# Patient Record
Sex: Female | Born: 1955 | Race: White | Hispanic: No | Marital: Married
Health system: Midwestern US, Academic
[De-identification: ages and names within clinical notes are randomized; demographics above are authoritative.]

---

## 2019-08-25 ENCOUNTER — Encounter: Admit: 2019-08-25 | Discharge: 2019-08-25 | Payer: BC Managed Care – PPO

## 2019-08-26 ENCOUNTER — Encounter: Admit: 2019-08-26 | Discharge: 2019-08-26 | Payer: BC Managed Care – PPO

## 2019-09-10 ENCOUNTER — Encounter: Admit: 2019-09-10 | Discharge: 2019-09-10 | Payer: BC Managed Care – PPO

## 2019-09-10 ENCOUNTER — Ambulatory Visit: Admit: 2019-09-10 | Discharge: 2019-09-10 | Payer: BC Managed Care – PPO

## 2019-09-10 DIAGNOSIS — J302 Other seasonal allergic rhinitis: Secondary | ICD-10-CM

## 2019-09-10 DIAGNOSIS — I10 Essential (primary) hypertension: Secondary | ICD-10-CM

## 2019-09-10 DIAGNOSIS — Z720 Tobacco use: Secondary | ICD-10-CM

## 2019-09-10 DIAGNOSIS — R519 Generalized headaches: Secondary | ICD-10-CM

## 2019-09-10 DIAGNOSIS — E7800 Pure hypercholesterolemia, unspecified: Secondary | ICD-10-CM

## 2019-09-10 LAB — TSH WITH FREE T4 REFLEX: Lab: 1.2 uU/mL (ref 0.35–5.00)

## 2019-09-10 LAB — PARATHYROID HORMONE: Lab: 437 pg/mL — ABNORMAL HIGH (ref 10–65)

## 2019-09-10 LAB — CALCIUM: Lab: 12 mg/dL — ABNORMAL HIGH (ref 8.5–10.6)

## 2019-09-10 LAB — IONIZED CALCIUM: Lab: 1.7 MMOL/L — ABNORMAL HIGH (ref 1.0–1.3)

## 2019-09-11 ENCOUNTER — Encounter: Admit: 2019-09-11 | Discharge: 2019-09-11 | Payer: BC Managed Care – PPO

## 2019-09-14 NOTE — Telephone Encounter
Pt calling to set up Surgery Schedule

## 2019-09-22 ENCOUNTER — Encounter: Admit: 2019-09-22 | Discharge: 2019-09-22 | Payer: BC Managed Care – PPO

## 2019-09-22 NOTE — Telephone Encounter
Spoke with patient about what preoperative testing she needs before scheduling surgery. Dr. Edilia Bo stated in his previous note that he wanted new labs (which patient obtained), a CT (getting on 10/15), 24hr urine(order faxed to Dr. Dion Body office) , and previous Dexa scan (requested from Dr. Dion Body office). Patient v/u and once testing is completed will contact Dr. Edilia Bo for surgery dates.

## 2019-09-22 NOTE — Telephone Encounter
Pt calling to reach Nurse in regards to Surgery Dates. Had questions to testing needing to be done.

## 2019-09-24 ENCOUNTER — Ambulatory Visit: Admit: 2019-09-24 | Discharge: 2019-09-24 | Payer: BC Managed Care – PPO

## 2019-09-24 ENCOUNTER — Encounter: Admit: 2019-09-24 | Discharge: 2019-09-24 | Payer: BC Managed Care – PPO

## 2019-09-24 LAB — POC CREATININE, RAD: Lab: 1.3 mg/dL — ABNORMAL HIGH (ref 0.4–1.00)

## 2019-09-24 MED ORDER — SODIUM CHLORIDE 0.9 % IJ SOLN
50 mL | Freq: Once | INTRAVENOUS | 0 refills | Status: CP
Start: 2019-09-24 — End: ?
  Administered 2019-09-24: 20:00:00 50 mL via INTRAVENOUS

## 2019-09-24 MED ORDER — IOHEXOL 350 MG IODINE/ML IV SOLN
80 mL | Freq: Once | INTRAVENOUS | 0 refills | Status: CP
Start: 2019-09-24 — End: ?
  Administered 2019-09-24: 20:00:00 80 mL via INTRAVENOUS

## 2019-09-25 ENCOUNTER — Encounter: Admit: 2019-09-25 | Discharge: 2019-09-25 | Payer: BC Managed Care – PPO

## 2019-09-25 DIAGNOSIS — E210 Primary hyperparathyroidism: Secondary | ICD-10-CM

## 2019-09-25 DIAGNOSIS — Z1159 Encounter for screening for other viral diseases: Secondary | ICD-10-CM

## 2019-09-28 ENCOUNTER — Encounter: Admit: 2019-09-28 | Discharge: 2019-09-28 | Payer: BC Managed Care – PPO

## 2019-09-28 DIAGNOSIS — J302 Other seasonal allergic rhinitis: Secondary | ICD-10-CM

## 2019-09-28 DIAGNOSIS — Z720 Tobacco use: Secondary | ICD-10-CM

## 2019-09-28 DIAGNOSIS — E7800 Pure hypercholesterolemia, unspecified: Secondary | ICD-10-CM

## 2019-09-28 DIAGNOSIS — I10 Essential (primary) hypertension: Secondary | ICD-10-CM

## 2019-09-28 DIAGNOSIS — R519 Generalized headaches: Secondary | ICD-10-CM

## 2019-10-04 ENCOUNTER — Encounter: Admit: 2019-10-04 | Discharge: 2019-10-05 | Payer: BC Managed Care – PPO

## 2019-10-04 DIAGNOSIS — Z1159 Encounter for screening for other viral diseases: Secondary | ICD-10-CM

## 2019-10-06 ENCOUNTER — Encounter: Admit: 2019-10-06 | Discharge: 2019-10-06 | Payer: BC Managed Care – PPO

## 2019-10-07 ENCOUNTER — Encounter: Admit: 2019-10-07 | Discharge: 2019-10-07 | Payer: BC Managed Care – PPO

## 2019-10-07 DIAGNOSIS — E7800 Pure hypercholesterolemia, unspecified: Secondary | ICD-10-CM

## 2019-10-07 DIAGNOSIS — R519 Generalized headaches: Secondary | ICD-10-CM

## 2019-10-07 DIAGNOSIS — Z720 Tobacco use: Secondary | ICD-10-CM

## 2019-10-07 DIAGNOSIS — I10 Essential (primary) hypertension: Secondary | ICD-10-CM

## 2019-10-07 DIAGNOSIS — J302 Other seasonal allergic rhinitis: Secondary | ICD-10-CM

## 2019-10-07 MED ORDER — DEXAMETHASONE SODIUM PHOSPHATE 4 MG/ML IJ SOLN
INTRAVENOUS | 0 refills | Status: DC
Start: 2019-10-07 — End: 2019-10-07
  Administered 2019-10-07: 21:00:00 10 mg via INTRAVENOUS

## 2019-10-07 MED ORDER — DEXTRAN 70-HYPROMELLOSE (PF) 0.1-0.3 % OP DPET
0 refills | Status: DC
Start: 2019-10-07 — End: 2019-10-07
  Administered 2019-10-07: 19:00:00 2 [drp] via OPHTHALMIC

## 2019-10-07 MED ORDER — SUCCINYLCHOLINE CHLORIDE 20 MG/ML IJ SOLN
INTRAVENOUS | 0 refills | Status: DC
Start: 2019-10-07 — End: 2019-10-07
  Administered 2019-10-07: 19:00:00 100 mg via INTRAVENOUS

## 2019-10-07 MED ORDER — LISINOPRIL 20 MG PO TAB
20 mg | Freq: Every day | ORAL | 0 refills | Status: DC
Start: 2019-10-07 — End: 2019-10-08

## 2019-10-07 MED ORDER — ONDANSETRON HCL (PF) 4 MG/2 ML IJ SOLN
4 mg | Freq: Once | INTRAVENOUS | 0 refills | Status: DC | PRN
Start: 2019-10-07 — End: 2019-10-07

## 2019-10-07 MED ORDER — DIPHENOXYLATE-ATROPINE 2.5-0.025 MG PO TAB
1 | Freq: Four times a day (QID) | ORAL | 0 refills | Status: DC | PRN
Start: 2019-10-07 — End: 2019-10-08

## 2019-10-07 MED ORDER — ONDANSETRON HCL (PF) 4 MG/2 ML IJ SOLN
INTRAVENOUS | 0 refills | Status: DC
Start: 2019-10-07 — End: 2019-10-07
  Administered 2019-10-07: 21:00:00 4 mg via INTRAVENOUS

## 2019-10-07 MED ORDER — ACETAMINOPHEN 325 MG PO TAB
650 mg | ORAL | 0 refills | Status: DC | PRN
Start: 2019-10-07 — End: 2019-10-08
  Administered 2019-10-07 – 2019-10-08 (×2): 650 mg via ORAL

## 2019-10-07 MED ORDER — OXYCODONE 5 MG PO TAB
5-10 mg | Freq: Once | ORAL | 0 refills | Status: DC | PRN
Start: 2019-10-07 — End: 2019-10-07

## 2019-10-07 MED ORDER — SODIUM CHLORIDE 0.9 % IV SOLP
INTRAVENOUS | 0 refills | Status: DC
Start: 2019-10-07 — End: 2019-10-08
  Administered 2019-10-07: 23:00:00 1000.000 mL via INTRAVENOUS

## 2019-10-07 MED ORDER — HALOPERIDOL LACTATE 5 MG/ML IJ SOLN
1 mg | Freq: Once | INTRAVENOUS | 0 refills | Status: DC | PRN
Start: 2019-10-07 — End: 2019-10-07

## 2019-10-07 MED ORDER — PROPOFOL 10 MG/ML IV EMUL 100 ML (INFUSION)(AM)(OR)
0 refills | Status: DC
Start: 2019-10-07 — End: 2019-10-07
  Administered 2019-10-07: 19:00:00 30 ug/kg/min via INTRAVENOUS

## 2019-10-07 MED ORDER — PATCH DOCUMENTATION - SCOPOLAMINE BASE 1 MG/72HR
1 | Freq: Two times a day (BID) | TRANSDERMAL | 0 refills | Status: DC
Start: 2019-10-07 — End: 2019-10-08

## 2019-10-07 MED ORDER — ONDANSETRON HCL (PF) 4 MG/2 ML IJ SOLN
4 mg | INTRAVENOUS | 0 refills | Status: DC | PRN
Start: 2019-10-07 — End: 2019-10-08

## 2019-10-07 MED ORDER — FENTANYL CITRATE (PF) 50 MCG/ML IJ SOLN
25 ug | INTRAVENOUS | 0 refills | Status: DC | PRN
Start: 2019-10-07 — End: 2019-10-07
  Administered 2019-10-07 (×4): 25 ug via INTRAVENOUS

## 2019-10-07 MED ORDER — AMLODIPINE 5 MG PO TAB
5 mg | Freq: Every day | ORAL | 0 refills | Status: DC
Start: 2019-10-07 — End: 2019-10-08
  Administered 2019-10-08: 14:00:00 5 mg via ORAL

## 2019-10-07 MED ORDER — CEFAZOLIN 1 GRAM IJ SOLR
0 refills | Status: DC
Start: 2019-10-07 — End: 2019-10-07
  Administered 2019-10-07: 19:00:00 2 g via INTRAVENOUS

## 2019-10-07 MED ORDER — LIDOCAINE (PF) 200 MG/10 ML (2 %) IJ SYRG
0 refills | Status: DC
Start: 2019-10-07 — End: 2019-10-07
  Administered 2019-10-07: 19:00:00 100 mg via INTRAVENOUS

## 2019-10-07 MED ORDER — LIDOCAINE-EPINEPHRINE 1 %-1:100,000 IJ SOLN
0 refills | Status: DC
Start: 2019-10-07 — End: 2019-10-07
  Administered 2019-10-07: 19:00:00 5 mL via INTRAMUSCULAR

## 2019-10-07 MED ORDER — SIMVASTATIN 40 MG PO TAB
40 mg | Freq: Every evening | ORAL | 0 refills | Status: DC
Start: 2019-10-07 — End: 2019-10-08
  Administered 2019-10-08: 03:00:00 40 mg via ORAL

## 2019-10-07 MED ORDER — OXYCODONE 5 MG PO TAB
5 mg | ORAL | 0 refills | Status: DC | PRN
Start: 2019-10-07 — End: 2019-10-08
  Administered 2019-10-08 (×2): 5 mg via ORAL

## 2019-10-07 MED ORDER — BISACODYL 10 MG RE SUPP
10 mg | Freq: Every day | RECTAL | 0 refills | Status: DC | PRN
Start: 2019-10-07 — End: 2019-10-08

## 2019-10-07 MED ORDER — HEPARIN, PORCINE (PF) 5,000 UNIT/0.5 ML IJ SYRG
5000 [IU] | SUBCUTANEOUS | 0 refills | Status: DC
Start: 2019-10-07 — End: 2019-10-08
  Administered 2019-10-08: 14:00:00 5000 [IU] via SUBCUTANEOUS

## 2019-10-07 MED ORDER — SCOPOLAMINE BASE 1 MG OVER 3 DAYS TD PT3D
1 | TRANSDERMAL | 0 refills | Status: DC
Start: 2019-10-07 — End: 2019-10-08
  Administered 2019-10-07: 23:00:00 1 via TRANSDERMAL

## 2019-10-07 MED ORDER — SUFENTANIL CITRATE 50 MCG/ML IV SOLN
0 refills | Status: DC
Start: 2019-10-07 — End: 2019-10-07
  Administered 2019-10-07 (×2): 5 ug via INTRAVENOUS
  Administered 2019-10-07: 19:00:00 20 ug via INTRAVENOUS

## 2019-10-07 MED ORDER — SODIUM CHLORIDE 0.9 % IR SOLN
0 refills | Status: DC
Start: 2019-10-07 — End: 2019-10-07
  Administered 2019-10-07: 19:00:00 1000 mL

## 2019-10-07 MED ORDER — MIDAZOLAM 1 MG/ML IJ SOLN
INTRAVENOUS | 0 refills | Status: DC
Start: 2019-10-07 — End: 2019-10-07
  Administered 2019-10-07: 19:00:00 2 mg via INTRAVENOUS

## 2019-10-07 MED ORDER — EPINEPHRINE 0.1 MG/ML IJ SYRG
0 refills | Status: DC
Start: 2019-10-07 — End: 2019-10-07
  Administered 2019-10-07: 21:00:00 10 mL via INTRAMUSCULAR

## 2019-10-07 MED ORDER — INDIGOTINDISULFONATE SODIUM 8 MG/ML (0.8 %) IJ SOLN
0 refills | Status: DC
Start: 2019-10-07 — End: 2019-10-07
  Administered 2019-10-07: 19:00:00 1 mL via TOPICAL

## 2019-10-07 MED ORDER — PROMETHAZINE 25 MG/ML IJ SOLN
6.25 mg | INTRAVENOUS | 0 refills | Status: DC | PRN
Start: 2019-10-07 — End: 2019-10-07

## 2019-10-07 MED ORDER — SODIUM CHLORIDE 0.9 % IV SOLP
1000 mL | INTRAVENOUS | 0 refills | Status: DC
Start: 2019-10-07 — End: 2019-10-07
  Administered 2019-10-07: 16:00:00 1000 mL via INTRAVENOUS

## 2019-10-07 MED ORDER — MELATONIN 5 MG PO TAB
5 mg | Freq: Every evening | ORAL | 0 refills | Status: DC
Start: 2019-10-07 — End: 2019-10-08

## 2019-10-07 MED ORDER — PROPOFOL INJ 10 MG/ML IV VIAL
0 refills | Status: DC
Start: 2019-10-07 — End: 2019-10-07
  Administered 2019-10-07: 19:00:00 30 mg via INTRAVENOUS
  Administered 2019-10-07: 19:00:00 120 mg via INTRAVENOUS

## 2019-10-07 MED ORDER — LISINOPRIL 20 MG PO TAB
20 mg | Freq: Every day | ORAL | 0 refills | Status: DC
Start: 2019-10-07 — End: 2019-10-08
  Administered 2019-10-08: 03:00:00 20 mg via ORAL

## 2019-10-07 MED ORDER — PHENYLEPHRINE IN 0.9% NACL(PF) 1 MG/10 ML (100 MCG/ML) IV SYRG
INTRAVENOUS | 0 refills | Status: DC
Start: 2019-10-07 — End: 2019-10-07
  Administered 2019-10-07: 19:00:00 100 ug via INTRAVENOUS
  Administered 2019-10-07: 19:00:00 150 ug via INTRAVENOUS
  Administered 2019-10-07: 19:00:00 100 ug via INTRAVENOUS
  Administered 2019-10-07: 19:00:00 50 ug via INTRAVENOUS
  Administered 2019-10-07: 19:00:00 200 ug via INTRAVENOUS

## 2019-10-07 MED ORDER — LIDOCAINE (PF) 10 MG/ML (1 %) IJ SOLN
.1-2 mL | INTRAMUSCULAR | 0 refills | Status: DC | PRN
Start: 2019-10-07 — End: 2019-10-07

## 2019-10-07 MED ORDER — PHENYLEPHRINE 40 MCG/ML IN NS IV DRIP (STD CONC)
0 refills | Status: DC
Start: 2019-10-07 — End: 2019-10-07
  Administered 2019-10-07 (×2): 0.5 ug/kg/min via INTRAVENOUS

## 2019-10-07 MED ORDER — ONDANSETRON HCL (PF) 4 MG/2 ML IJ SOLN
4 mg | Freq: Once | INTRAVENOUS | 0 refills | Status: CP
Start: 2019-10-07 — End: ?
  Administered 2019-10-07: 16:00:00 4 mg via INTRAVENOUS

## 2019-10-07 MED ORDER — FENTANYL CITRATE (PF) 50 MCG/ML IJ SOLN
50 ug | INTRAVENOUS | 0 refills | Status: DC | PRN
Start: 2019-10-07 — End: 2019-10-07

## 2019-10-07 MED ORDER — MAGNESIUM HYDROXIDE 2,400 MG/10 ML PO SUSP
10 mL | Freq: Every day | ORAL | 0 refills | Status: DC | PRN
Start: 2019-10-07 — End: 2019-10-08

## 2019-10-08 MED ORDER — MAGNESIUM SULFATE IN D5W 1 GRAM/100 ML IV PGBK
1 g | INTRAVENOUS | 0 refills | Status: CP
Start: 2019-10-08 — End: ?
  Administered 2019-10-08: 14:00:00 1 g via INTRAVENOUS

## 2019-10-08 MED ORDER — LORATADINE 10 MG PO TAB
10 mg | Freq: Every day | ORAL | 0 refills | Status: DC
Start: 2019-10-08 — End: 2019-10-08

## 2019-10-08 MED ORDER — OXYCODONE 5 MG PO TAB
5 mg | ORAL_TABLET | ORAL | 0 refills | 6.00000 days | Status: AC | PRN
Start: 2019-10-08 — End: ?

## 2019-10-08 MED ORDER — FLUTICASONE PROPIONATE 50 MCG/ACTUATION NA SPSN
1 | Freq: Every day | NASAL | 0 refills | Status: DC
Start: 2019-10-08 — End: 2019-10-08
  Administered 2019-10-08: 15:00:00 1 via NASAL

## 2019-10-08 MED ORDER — ACETAMINOPHEN 650 MG PO TBER
650 mg | ORAL_TABLET | ORAL | 0 refills | Status: AC | PRN
Start: 2019-10-08 — End: ?

## 2019-10-08 MED ORDER — MONTELUKAST 10 MG PO TAB
10 mg | Freq: Every day | ORAL | 0 refills | Status: DC
Start: 2019-10-08 — End: 2019-10-08

## 2019-10-09 ENCOUNTER — Encounter: Admit: 2019-10-09 | Discharge: 2019-10-09 | Payer: BC Managed Care – PPO

## 2019-10-09 DIAGNOSIS — E7800 Pure hypercholesterolemia, unspecified: Secondary | ICD-10-CM

## 2019-10-09 DIAGNOSIS — Z720 Tobacco use: Secondary | ICD-10-CM

## 2019-10-09 DIAGNOSIS — J302 Other seasonal allergic rhinitis: Secondary | ICD-10-CM

## 2019-10-09 DIAGNOSIS — I10 Essential (primary) hypertension: Secondary | ICD-10-CM

## 2019-10-09 DIAGNOSIS — R519 Generalized headaches: Secondary | ICD-10-CM

## 2019-10-12 ENCOUNTER — Encounter: Admit: 2019-10-12 | Discharge: 2019-10-12 | Payer: BC Managed Care – PPO

## 2019-10-12 NOTE — Telephone Encounter
Pt calling in regards to scheduling a Calcium Count?  Wanted to speak to nurse

## 2019-10-12 NOTE — Telephone Encounter
Called patient back in regards to calcium labs. She stated that over the weekend she started to not feel right. She felt tingling in arms and all over. She called Dr. Edilia Bo and was told to get levels checked in [place] which she did today. I will call [facility] tomorrow at [phone] to have results faxed over so we can continue plan of care for patient. I will call patient after results are obtained.

## 2019-10-13 ENCOUNTER — Observation Stay: Admit: 2019-10-13 | Discharge: 2019-10-13 | Payer: BC Managed Care – PPO

## 2019-10-13 ENCOUNTER — Encounter: Admit: 2019-10-13 | Discharge: 2019-10-13 | Payer: BC Managed Care – PPO

## 2019-10-13 DIAGNOSIS — J302 Other seasonal allergic rhinitis: Secondary | ICD-10-CM

## 2019-10-13 DIAGNOSIS — I10 Essential (primary) hypertension: Secondary | ICD-10-CM

## 2019-10-13 DIAGNOSIS — Z720 Tobacco use: Secondary | ICD-10-CM

## 2019-10-13 DIAGNOSIS — E7800 Pure hypercholesterolemia, unspecified: Secondary | ICD-10-CM

## 2019-10-13 DIAGNOSIS — R519 Generalized headaches: Secondary | ICD-10-CM

## 2019-10-13 MED ORDER — CALCIUM GLUCONATE 100 MG/ML (10%) IV SOLN
1 g | Freq: Once | INTRAVENOUS | 0 refills | Status: DC
Start: 2019-10-13 — End: 2019-10-14

## 2019-10-13 MED ORDER — CALCIUM GLUCONATE 1G/100ML IVPB (MB+)
1 g | Freq: Once | INTRAVENOUS | 0 refills | Status: CP
Start: 2019-10-13 — End: ?

## 2019-10-13 NOTE — Telephone Encounter
Called patient to let her know that her Ca+ is low at 6.9, which was drawn at [facility] yesterday (phone number [phone]). Dr. Edilia Bo recommended admission for this. Patient v/u and is going to line up a ride. We recommended [place] for admission since patient recently had surgery here. Patient agreed and will be coming into ED here.

## 2019-10-14 ENCOUNTER — Encounter: Admit: 2019-10-14 | Discharge: 2019-10-14 | Payer: BC Managed Care – PPO

## 2019-10-14 DIAGNOSIS — J302 Other seasonal allergic rhinitis: Secondary | ICD-10-CM

## 2019-10-14 DIAGNOSIS — Z720 Tobacco use: Secondary | ICD-10-CM

## 2019-10-14 DIAGNOSIS — E7800 Pure hypercholesterolemia, unspecified: Secondary | ICD-10-CM

## 2019-10-14 DIAGNOSIS — E0590 Thyrotoxicosis, unspecified without thyrotoxic crisis or storm: Secondary | ICD-10-CM

## 2019-10-14 DIAGNOSIS — R519 Generalized headaches: Secondary | ICD-10-CM

## 2019-10-14 DIAGNOSIS — I10 Essential (primary) hypertension: Secondary | ICD-10-CM

## 2019-10-14 LAB — CBC AND DIFF
Lab: 0 10*3/uL (ref 0–0.20)
Lab: 0.1 10*3/uL (ref 0–0.45)
Lab: 10 10*3/uL (ref 4.5–11.0)
Lab: 21 — ABNORMAL HIGH (ref <20.7)

## 2019-10-14 LAB — COMPREHENSIVE METABOLIC PANEL
Lab: 0.6 mg/dL (ref 0.3–1.2)
Lab: 0.9 mg/dL (ref 0.4–1.00)
Lab: 102 mg/dL — ABNORMAL HIGH (ref 70–100)
Lab: 11 10*3/uL (ref 3–12)
Lab: 141 MMOL/L — ABNORMAL LOW (ref 137–147)
Lab: 16 mg/dL (ref 7–25)
Lab: 18 U/L (ref 7–40)
Lab: 200 U/L — ABNORMAL HIGH (ref 25–110)
Lab: 25 MMOL/L (ref 21–30)
Lab: 33 U/L (ref 7–56)
Lab: 4.1 g/dL (ref 3.5–5.0)
Lab: 6.8 g/dL (ref 6.0–8.0)
Lab: 7.2 mg/dL — ABNORMAL LOW (ref 8.5–10.6)
Lab: >60 mL/min — ABNORMAL HIGH (ref >60)

## 2019-10-14 LAB — URINALYSIS MICROSCOPIC REFLEX TO CULTURE

## 2019-10-14 LAB — URINALYSIS DIPSTICK REFLEX TO CULTURE
Lab: NEGATIVE
Lab: NEGATIVE
Lab: NEGATIVE
Lab: NEGATIVE
Lab: NEGATIVE

## 2019-10-14 LAB — IONIZED CALCIUM: Lab: 0.8 MMOL/L — ABNORMAL LOW (ref 1.0–1.3)

## 2019-10-14 LAB — MAGNESIUM: Lab: 1.6 mg/dL — ABNORMAL LOW (ref 1.6–2.6)

## 2019-10-14 MED ORDER — MAGNESIUM OXIDE 400 MG (241.3 MG MAGNESIUM) PO TAB
400 mg | ORAL_TABLET | Freq: Every day | ORAL | 0 refills | Status: AC
Start: 2019-10-14 — End: ?

## 2019-10-14 MED ORDER — CYANOCOBALAMIN (VITAMIN B-12) 1,000 MCG PO TAB
1000 ug | ORAL_TABLET | Freq: Every day | ORAL | 3 refills | 29.00000 days | Status: AC
Start: 2019-10-14 — End: ?

## 2019-10-14 MED ORDER — MAGNESIUM CHLORIDE 64 MG PO TBER
535 mg | Freq: Once | ORAL | 0 refills | Status: CP
Start: 2019-10-14 — End: ?
  Administered 2019-10-14: 19:00:00 535 mg via ORAL

## 2019-10-14 MED ORDER — ACETAMINOPHEN 325 MG PO TAB
650 mg | ORAL | 0 refills | Status: DC | PRN
Start: 2019-10-14 — End: 2019-10-14

## 2019-10-14 MED ORDER — LORATADINE 10 MG PO TAB
10 mg | Freq: Every day | ORAL | 0 refills | Status: DC
Start: 2019-10-14 — End: 2019-10-14
  Administered 2019-10-14: 15:00:00 10 mg via ORAL

## 2019-10-14 MED ORDER — AMLODIPINE 5 MG PO TAB
5 mg | Freq: Every day | ORAL | 0 refills | Status: DC
Start: 2019-10-14 — End: 2019-10-14
  Administered 2019-10-14: 15:00:00 5 mg via ORAL

## 2019-10-14 MED ORDER — MONTELUKAST 10 MG PO TAB
10 mg | Freq: Every evening | ORAL | 0 refills | Status: DC
Start: 2019-10-14 — End: 2019-10-14

## 2019-10-14 MED ORDER — SIMVASTATIN 40 MG PO TAB
40 mg | Freq: Every evening | ORAL | 0 refills | Status: DC
Start: 2019-10-14 — End: 2019-10-14

## 2019-10-14 MED ORDER — MELATONIN 5 MG PO TAB
5 mg | Freq: Every evening | ORAL | 0 refills | Status: DC
Start: 2019-10-14 — End: 2019-10-14
  Administered 2019-10-14: 08:00:00 5 mg via ORAL

## 2019-10-14 MED ORDER — CHOLECALCIFEROL (VITAMIN D3) 50 MCG (2,000 UNIT) PO CAP
2000 [IU] | ORAL_CAPSULE | Freq: Every day | ORAL | 3 refills | 84.00000 days | Status: AC
Start: 2019-10-14 — End: ?

## 2019-10-14 MED ORDER — MAGNESIUM SULFATE IN D5W 1 GRAM/100 ML IV PGBK
1 g | INTRAVENOUS | 0 refills | Status: CP
Start: 2019-10-14 — End: ?
  Administered 2019-10-14: 13:00:00 1 g via INTRAVENOUS

## 2019-10-14 MED ORDER — POTASSIUM CHLORIDE 20 MEQ PO TBTQ
40 meq | Freq: Once | ORAL | 0 refills | Status: CP
Start: 2019-10-14 — End: ?
  Administered 2019-10-14: 19:00:00 40 meq via ORAL

## 2019-10-14 MED ORDER — LISINOPRIL 20 MG PO TAB
20 mg | Freq: Every day | ORAL | 0 refills | Status: DC
Start: 2019-10-14 — End: 2019-10-14
  Administered 2019-10-14: 15:00:00 20 mg via ORAL

## 2019-10-14 MED ORDER — CALCIUM GLUCONATE 1G/100ML IVPB (MB+)
1 g | Freq: Once | INTRAVENOUS | 0 refills | Status: CP
Start: 2019-10-14 — End: ?
  Administered 2019-10-14 (×2): 1 g via INTRAVENOUS

## 2019-10-14 MED ORDER — COSYNTROPIN 0.25 MG IJ SOLR
.25 mg | Freq: Once | INTRAVENOUS | 0 refills | Status: DC
Start: 2019-10-14 — End: 2019-10-14

## 2019-10-14 MED ORDER — MAGNESIUM SULFATE IN D5W 1 GRAM/100 ML IV PGBK
1 g | INTRAVENOUS | 0 refills | Status: DC
Start: 2019-10-14 — End: 2019-10-14

## 2019-10-14 MED ORDER — CYANOCOBALAMIN (VITAMIN B-12) 1,000 MCG/ML IJ SOLN
1000 ug | Freq: Every day | INTRAMUSCULAR | 0 refills | Status: DC
Start: 2019-10-14 — End: 2019-10-14
  Administered 2019-10-14: 15:00:00 1000 ug via INTRAMUSCULAR

## 2019-10-14 MED ORDER — ENOXAPARIN 40 MG/0.4 ML SC SYRG
40 mg | Freq: Every day | SUBCUTANEOUS | 0 refills | Status: DC
Start: 2019-10-14 — End: 2019-10-14

## 2019-10-14 MED ORDER — ASPIRIN 81 MG PO TBEC
81 mg | Freq: Every day | ORAL | 0 refills | Status: DC
Start: 2019-10-14 — End: 2019-10-14
  Administered 2019-10-14: 15:00:00 81 mg via ORAL

## 2019-10-14 MED ORDER — CALCIUM CARBONATE 500 MG CALCIUM (1,250 MG) PO TAB
1250 mg | ORAL_TABLET | Freq: Two times a day (BID) | ORAL | 3 refills | 33.00000 days | Status: AC
Start: 2019-10-14 — End: ?

## 2019-10-14 MED ORDER — FLUTICASONE PROPIONATE 50 MCG/ACTUATION NA SPSN
1 | Freq: Every day | NASAL | 0 refills | Status: DC
Start: 2019-10-14 — End: 2019-10-14

## 2019-10-14 MED ORDER — CALCIUM CARBONATE 500 MG CALCIUM (1,250 MG) PO TAB
1250 mg | Freq: Two times a day (BID) | ORAL | 0 refills | Status: DC
Start: 2019-10-14 — End: 2019-10-14
  Administered 2019-10-14: 15:00:00 1250 mg via ORAL

## 2019-10-14 NOTE — ED Notes
Last Wed pt states she had R Parathyroidectomy.  Saturday pt states she was feeling "funny" fingers were tingling and pt had a headache.  Yesterday they had her check her Calcium level and it was low- today she was told to come to the ED.  Pt states she is still having tingling in her fingertips - denies any pain - slight dull headache.  Pt A&O x4.  Resting comfortably with husband at bedside.      Belongings - sweat pants/ tshirt/ sweatshirt/ slippers/ cell phone with charger/ green bag with other clothing and toiletry items.  Denies any cash or CC, jewelry.

## 2019-10-14 NOTE — ED Notes
Pt states she

## 2019-10-22 ENCOUNTER — Encounter: Admit: 2019-10-22 | Discharge: 2019-10-22 | Payer: BC Managed Care – PPO

## 2019-10-22 ENCOUNTER — Ambulatory Visit: Admit: 2019-10-22 | Discharge: 2019-10-22 | Payer: BC Managed Care – PPO

## 2019-10-22 DIAGNOSIS — Z720 Tobacco use: Secondary | ICD-10-CM

## 2019-10-22 DIAGNOSIS — D351 Benign neoplasm of parathyroid gland: Secondary | ICD-10-CM

## 2019-10-22 DIAGNOSIS — J302 Other seasonal allergic rhinitis: Secondary | ICD-10-CM

## 2019-10-22 DIAGNOSIS — E213 Hyperparathyroidism, unspecified: Secondary | ICD-10-CM

## 2019-10-22 DIAGNOSIS — E7800 Pure hypercholesterolemia, unspecified: Secondary | ICD-10-CM

## 2019-10-22 DIAGNOSIS — R519 Generalized headaches: Secondary | ICD-10-CM

## 2019-10-22 DIAGNOSIS — I10 Essential (primary) hypertension: Secondary | ICD-10-CM

## 2019-10-22 LAB — COMPREHENSIVE METABOLIC PANEL
Lab: 0.2 mg/dL — ABNORMAL LOW (ref >60)
Lab: 0.9 mg/dL — ABNORMAL LOW (ref 0.4–1.00)
Lab: 109 mg/dL — ABNORMAL HIGH (ref 70–100)
Lab: 14 U/L (ref 7–40)
Lab: 141 MMOL/L (ref 137–147)
Lab: 15 mg/dL (ref 7–25)
Lab: 17 U/L (ref 7–56)
Lab: 198 U/L — ABNORMAL HIGH (ref 25–110)
Lab: 26 MMOL/L — ABNORMAL HIGH (ref 21–30)
Lab: 3.9 g/dL (ref 3.5–5.0)
Lab: 4 MMOL/L (ref 3.5–5.1)
Lab: 57 mL/min — ABNORMAL LOW (ref >60)
Lab: 7 g/dL — ABNORMAL HIGH (ref >60)
Lab: 9.3 mg/dL (ref 8.5–10.6)
Lab: >60 mL/min (ref >60)
Lab: 8 (ref 3–12)

## 2019-10-22 LAB — PARATHYROID HORMONE: Lab: 51 pg/mL (ref 10–65)

## 2019-10-22 NOTE — Patient Instructions
Scar Care  1. Beginning 2 weeks post operatively, use a scar care product such as Mederma, vitamin E oil, coconut oil or silicone scar strips/gel 2 times a day.  2. Massage the incision 4 times a day. You can go along the incision or massage in a circular fashion. This will help to desensitize the incision and break up scar tissue.  3. Wear sunscreen, SPF 30+, on the incision at all times. This helps to prevent darkening or reddening of the incision.   These techniques are most effective during the first 2-3 months after surgery.

## 2019-10-22 NOTE — Progress Notes
Date of Service: 10/22/2019    Subjective:             Mindy Horton is a 63 y.o. female.    History of Present Illness  Mindy Horton  is s/p R parathyroidectomy with L parathyroid autotransplantation by Dr. Lottie Dawson on 10/07/2019.    Findings:??Non-localizing parathyroid. ?Left superior enlarged (corresponding to adenoma on 4DCT), PTH did not drop appropriately. ?Proceeded with 4 gland exploration. ?Right superior para enlarged and removed, PTH drop from 289 baseline to 46.7 at T-15. ?Right inferior para marked with 3 med clips. ?Left superior gland was re-implanted.    Baseline 289  T0- 223.4  T15- 240.5  ?  Second gland  T0-126.5  T15- 46.7    She was then re-admitted briefly from 11/3-11/4 for hypocalcemia. She is currently taking calcium carbonate BID. She denies digital paresthesias or perioral numbness. Has an appointment with local referring Endo physician, Dr. Threasa Beards in [place], [place], in 1 month. Here today for postoperative visit.    The pathology revealed:      Final Diagnosis:     A. Parathyroid, right thyroid mass, rule out parathyroid,   parathyroidectomy: ?   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 ? ? ?        Review of Systems   Constitutional: Negative.    HENT: Negative.    Eyes: Negative.    Respiratory: Negative.    Cardiovascular: Negative.    Gastrointestinal: Negative.    Endocrine: Negative.    Genitourinary: Negative.    Musculoskeletal: Negative.    Skin: Negative.    Allergic/Immunologic: Negative.    Neurological: Negative.    Hematological: Negative.    Psychiatric/Behavioral: Negative.          Objective:         ? acetaminophen SR (TYLENOL) 650 mg tablet Take one tablet by mouth every 6 hours as needed for Pain.   ? amLODIPine (NORVASC) 5 mg tablet Take 5 mg by mouth daily.   ? aspirin EC 81 mg tablet Take 81 mg by mouth daily. Take with food.   ? calcium carbonate (OS-CAL) 1250 mg tablet Take one tablet by mouth twice daily. ? Cholecalciferol (Vitamin D3) (VITAMIN D-3) 50 mcg (2,000 unit) cap Take one capsule by mouth daily.   ? cyanocobalamin (VITAMIN B-12) 1,000 mcg tablet Take one tablet by mouth daily.   ? fexofenadine (ALLEGRA) 180 mg tablet Take 180 mg by mouth daily.   ? fluticasone propionate (FLONASE) 50 mcg/actuation nasal spray, suspension Apply 2 sprays to each nostril as directed daily.   ? lisinopriL (ZESTRIL) 20 mg tablet Take 20 mg by mouth at bedtime daily.   ? magnesium oxide (MAG-OX) 400 mg (241.3 mg magnesium) tablet Take one tablet by mouth daily.   ? melatonin 5 mg tab Take 5 mg by mouth at bedtime daily.   ? montelukast (SINGULAIR) 10 mg tablet Take 10 mg by mouth at bedtime daily.   ? oxyCODONE (ROXICODONE) 5 mg tablet Take one tablet by mouth every 4 hours as needed for Pain (pain uncontrolled by acetaminophen)   ? simvastatin (ZOCOR) 40 mg tablet Take 40 mg by mouth at bedtime daily.     Vitals:    10/22/19 0758   BP: 126/60   Pulse: 83   Temp: 36.1 ?C (97 ?F)   Weight: 74 kg (163 lb 3.2 oz)   Height: 165.2 cm (65.04)   PainSc: Zero     Body mass index is 27.13 kg/m?Marland Kitchen  Physical Exam  All incisions are clean and dry without signs of infection, dehiscence, discharge or poor healing. There are no fluid collections or fluctuant areas. There is no mass or adenopathy appreciated. Steri strips removed and incision intact.     Assessment and Plan:  [name]  is recovering well s/p R parathyroidectomy with L parathyroid autotransplantation by Dr. Lottie Dawson on 10/07/2019.    [name]  should continue their local wound care. 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 Pathology was reviewed with the pt. and a copy of their pathology report was given to them today. Scar care was discussed with the pt. They will start using over the counter scar cream to the incision and will massage the incision to promote scar tissue absorption. The pt. will also wear 35 SPF sunscreen or greater with sun exposure to prevent reddening of the incision.     F/U: 2 months with Dr. Lottie Dawson, sooner on a prn basis    PTH and CMP today to check on parathyroid function.     F/U with Dr. Threasa Beards, local enodcrinologist, as scheduled in 1 month.

## 2019-10-26 ENCOUNTER — Encounter: Admit: 2019-10-26 | Discharge: 2019-10-26 | Payer: BC Managed Care – PPO

## 2019-10-26 NOTE — Telephone Encounter
Requesting permission to have dental work after her surgery, call [phone].

## 2019-10-29 ENCOUNTER — Encounter: Admit: 2019-10-29 | Discharge: 2019-10-29 | Payer: BC Managed Care – PPO

## 2019-10-29 NOTE — Telephone Encounter
Patient called back asking about prophylactic antibiotics. I reiterated to patient that no, from our standpoint she does not need anything prior to a filling. If she does the Dentist would need to be the one to order it. Patient v/u.

## 2019-10-29 NOTE — Telephone Encounter
Mickell Vandewalle MRN: [id number] calling to reach you in regards to inform yyou of antibiotics and Dental information  Dentist needs to know if she needs to take an antibiotic or not

## 2019-11-02 ENCOUNTER — Encounter: Admit: 2019-11-02 | Discharge: 2019-11-02 | Payer: BC Managed Care – PPO

## 2019-11-02 NOTE — Telephone Encounter
-----   Message from Wanda Plump, PA-C sent at 10/30/2019  2:55 PM CST -----  Arby Barrette,   Calcium and PTH wnl. Can you please call patient and let her know? Thanks!

## 2019-12-31 ENCOUNTER — Encounter: Admit: 2019-12-31 | Discharge: 2019-12-31 | Payer: BC Managed Care – PPO

## 2019-12-31 ENCOUNTER — Ambulatory Visit: Admit: 2019-12-31 | Discharge: 2019-12-31 | Payer: BC Managed Care – PPO

## 2020-04-25 IMAGING — CR CHEST
2 series · 2 of 2 positions shown · non-contrast
Comparison: none

[chest pa]
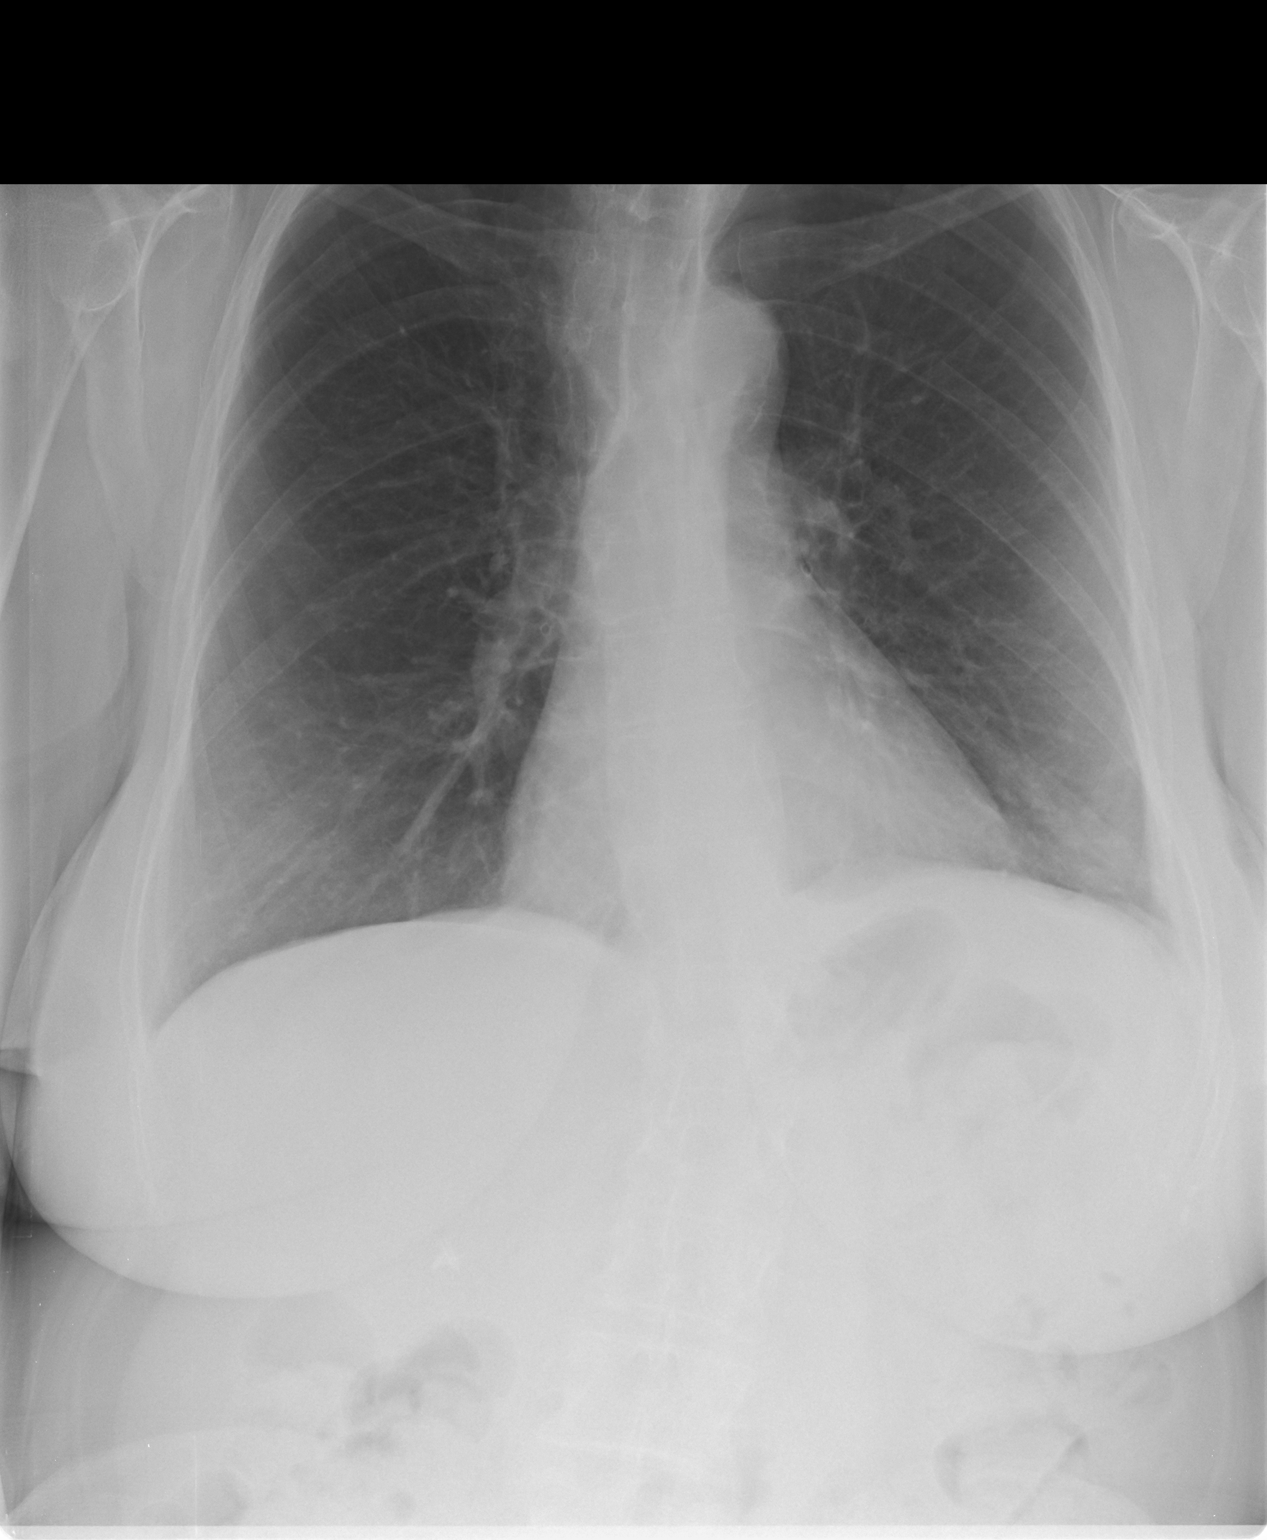

[chest lat]
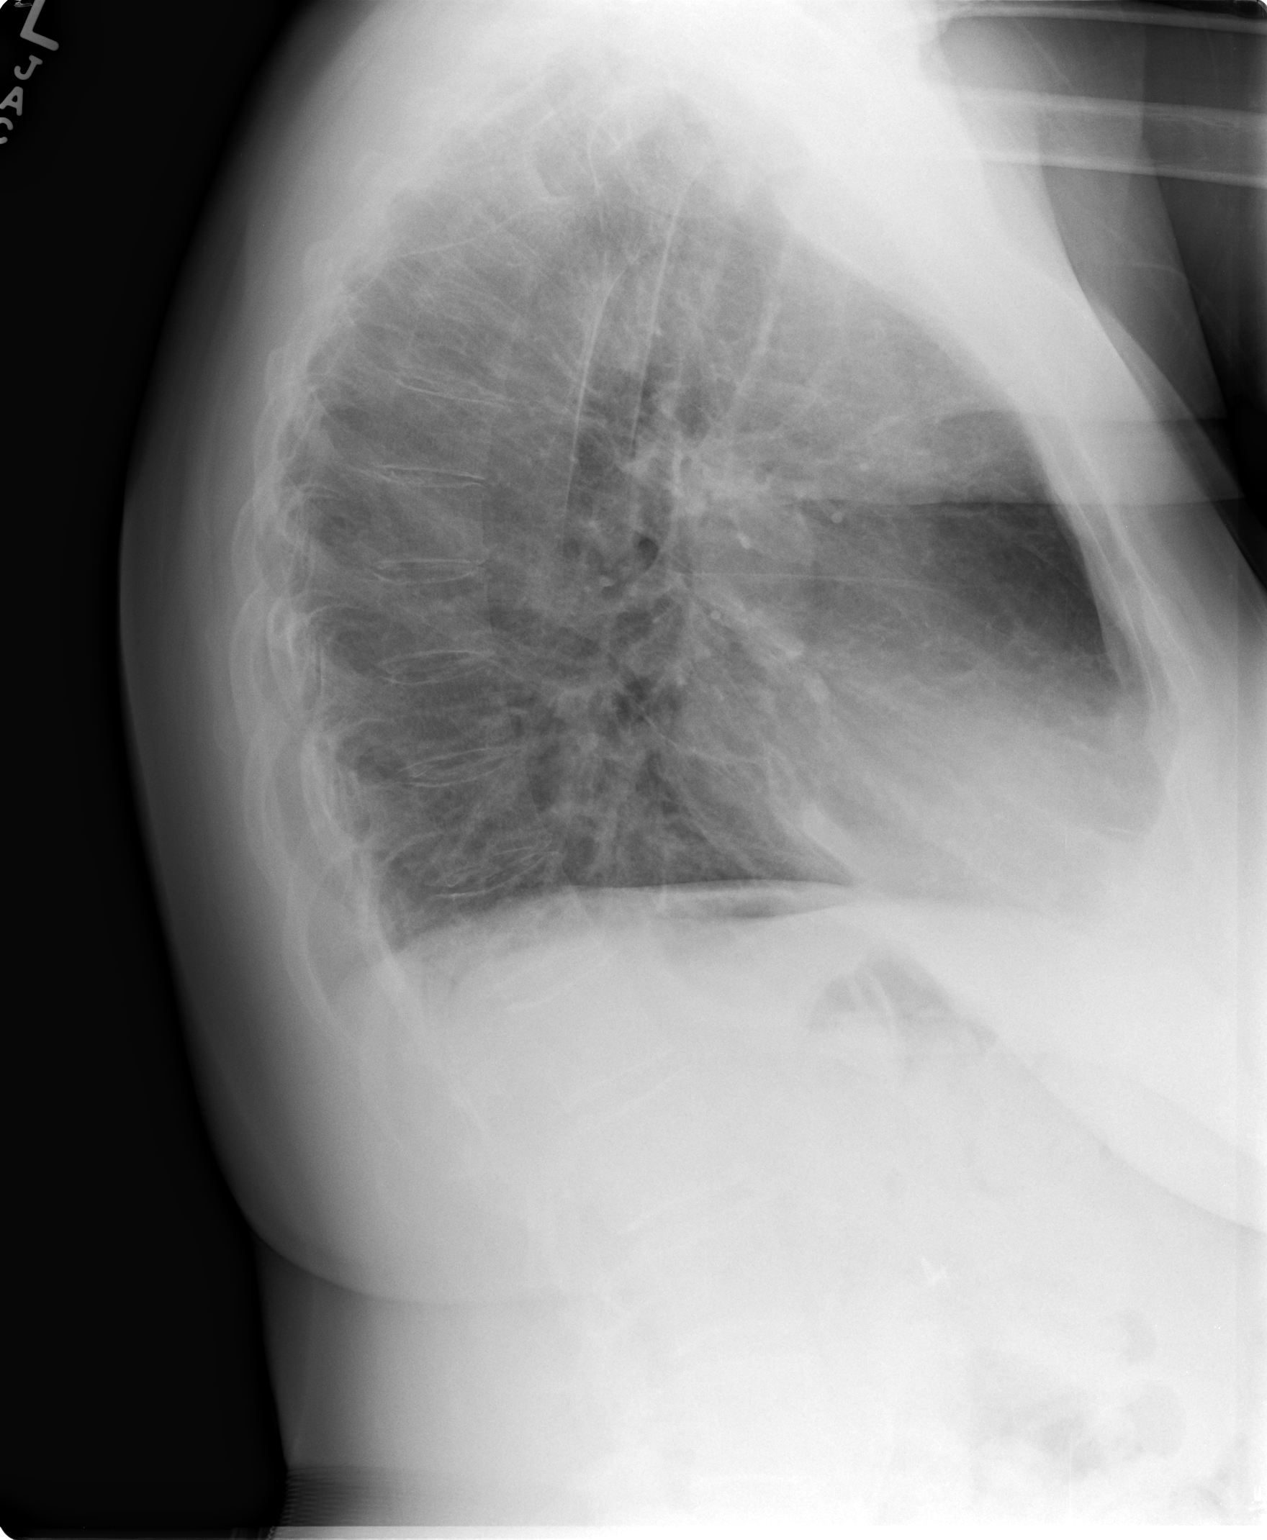

[2 of 2 positions shown; findings below may reference images not displayed]

DIAGNOSTIC STUDIES

EXAM
RADIOLOGICAL EXAMINATION, CHEST; 2 VIEWS FRONTAL AND LATERAL CPT 41070

INDICATION
hypercalcemia
Pt states high calcium levels x 2 days. H/O smoker x 40 years. Pt c/o fatigue x 1 month. JC/AK

TECHNIQUE
2 views of the chest were acquired.

COMPARISONS
No prior studies are available for comparison.

FINDINGS
The cardiac silhouette is within normal limits for size. The mediastinum is not widened or
deviated. The lungs are clear and the costophrenic sulci are sharp. Pulmonary vasculature is normal
caliber. No pneumothorax is identified.

IMPRESSION
No acute cardiopulmonary process.

Tech Notes:

Pt states high calcium levels x 2 days. H/O smoker x 40 years. Pt c/o fatigue x 1 month. JC/AK

## 2024-11-11 ENCOUNTER — Encounter: Admit: 2024-11-11 | Discharge: 2024-11-11 | Payer: BLUE CROSS/BLUE SHIELD
